# Patient Record
Sex: Male | Born: 2000
Health system: Southern US, Community
[De-identification: ages and names within clinical notes are randomized; demographics above are authoritative.]

## PROBLEM LIST (undated history)

## (undated) DIAGNOSIS — F32A Depression, unspecified: Secondary | ICD-10-CM

## (undated) DIAGNOSIS — T7840XA Allergy, unspecified, initial encounter: Secondary | ICD-10-CM

## (undated) DIAGNOSIS — F909 Attention-deficit hyperactivity disorder, unspecified type: Secondary | ICD-10-CM

## (undated) DIAGNOSIS — F913 Oppositional defiant disorder: Secondary | ICD-10-CM

## (undated) DIAGNOSIS — F329 Major depressive disorder, single episode, unspecified: Secondary | ICD-10-CM

## (undated) HISTORY — DX: Depression, unspecified: F32.A

## (undated) HISTORY — DX: Major depressive disorder, single episode, unspecified: F32.9

## (undated) HISTORY — DX: Allergy, unspecified, initial encounter: T78.40XA

---
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
## 2019-03-29 ENCOUNTER — Other Ambulatory Visit: Payer: Self-pay | Admitting: Nurse Practitioner

## 2019-03-29 DIAGNOSIS — F902 Attention-deficit hyperactivity disorder, combined type: Secondary | ICD-10-CM

## 2019-04-16 ENCOUNTER — Other Ambulatory Visit: Payer: Self-pay | Admitting: Nurse Practitioner

## 2019-04-16 DIAGNOSIS — F411 Generalized anxiety disorder: Secondary | ICD-10-CM

## 2019-04-21 ENCOUNTER — Telehealth: Payer: Self-pay | Admitting: Nurse Practitioner

## 2019-04-21 NOTE — Telephone Encounter (Signed)
Questions for Screening COVID-19  Symptom onset: n/a  Travel or Contacts: no  During this illness, did/does the patient experience any of the following symptoms? Fever >100.69F []   Yes [x]   No []   Unknown Subjective fever (felt feverish) []   Yes []   No []   Unknown Chills []   Yes [x]   No []   Unknown Muscle aches (myalgia) []   Yes [x]   No []   Unknown Runny nose (rhinorrhea) []   Yes [x]   No []   Unknown Sore throat []   Yes [x]   No []   Unknown Cough (new onset or worsening of chronic cough) []   Yes [x]   No []   Unknown Shortness of breath (dyspnea) []   Yes [x]   No []   Unknown Nausea or vomiting []   Yes [x]   No []   Unknown Headache []   Yes [x]   No []   Unknown Abdominal pain  []   Yes [x]   No []   Unknown Diarrhea (?3 loose/looser than normal stools/24hr period) []   Yes [x]   No []   Unknown Other, specify:

## 2019-04-22 ENCOUNTER — Ambulatory Visit: Payer: 59 | Admitting: Nurse Practitioner

## 2019-04-22 ENCOUNTER — Encounter: Payer: Self-pay | Admitting: Nurse Practitioner

## 2019-04-22 ENCOUNTER — Other Ambulatory Visit: Payer: Self-pay

## 2019-04-22 VITALS — BP 122/74 | HR 66 | Temp 97.8°F | Ht 67.12 in | Wt 141.6 lb

## 2019-04-22 DIAGNOSIS — F902 Attention-deficit hyperactivity disorder, combined type: Secondary | ICD-10-CM | POA: Diagnosis not present

## 2019-04-22 DIAGNOSIS — F411 Generalized anxiety disorder: Secondary | ICD-10-CM

## 2019-04-22 LAB — BASIC METABOLIC PANEL
BUN: 13 mg/dL (ref 6–23)
CO2: 29 mEq/L (ref 19–32)
Calcium: 8.9 mg/dL (ref 8.4–10.5)
Chloride: 107 mEq/L (ref 96–112)
Creatinine, Ser: 0.86 mg/dL (ref 0.40–1.50)
GFR: 114.56 mL/min (ref >60.00)
Glucose, Bld: 97 mg/dL (ref 70–99)
Potassium: 4 mEq/L (ref 3.5–5.1)
Sodium: 141 mEq/L (ref 135–145)

## 2019-04-22 MED ORDER — ESCITALOPRAM OXALATE 10 MG PO TABS: 10.0000 mg | ORAL_TABLET | Freq: Every day | ORAL | 1 refills | Status: DC

## 2019-04-22 MED ORDER — GUANFACINE HCL ER 4 MG PO TB24: 4.0000 mg | ORAL_TABLET | Freq: Every day | ORAL | 1 refills | Status: DC

## 2019-04-22 NOTE — Progress Notes (Signed)
Subjective:  Patient ID: Jeffrey [name], male    DOB: [birth year]  Age: 18 y.o. MRN: [id number]  CC: Follow-up (medication refill/)  HPI ADHD: Use of intuniv at this time, admits to skipping some doses. 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 Sexually active with 54female partner at this time, describes relationship as stable, no use of condoms. Daily use of electronic cigarette with nicotine (45mg ) and marijuana.  Anxiety: Stable mood with lexapro, admits to skipping some doses GAD 7 : Generalized Anxiety Score 04/22/2019 04/26/2018  Nervous, Anxious, on Edge 2 1  Control/stop worrying 1 2  Worry too much - different things 2 2  Trouble relaxing 1 1  Restless 0 3  Easily annoyed or irritable 2 2  Afraid - awful might happen 1 1  Total GAD 7 Score 9 12   Depression screen [facility] 2/9 04/22/2019 11/09/2018 04/26/2018  Decreased Interest 0 1 1  Down, Depressed, Hopeless 2 2 2   PHQ - 2 Score 2 3 3   Altered sleeping 1 1 2   Tired, decreased energy 2 1 1   Change in appetite 0 1 3  Feeling bad or failure about yourself  2 1 1   Trouble concentrating 0 1 1  Moving slowly or fidgety/restless 0 1 1  Suicidal thoughts 0 0 0  PHQ-9 Score 7 9 12   Difficult doing work/chores - Somewhat difficult -   Reviewed past Medical, Social and Family history today.  Outpatient Medications Prior to Visit  Medication Sig Dispense Refill  . beclomethasone (BECONASE-AQ) 42 MCG/SPRAY nasal spray Place 2 sprays into both nostrils daily as needed (sinonasal congestion or itching of allergic rhinitis). Dose is for each nostril.    Marland Kitchen desloratadine (CLARINEX) 5 MG tablet Take 1 tablet (5 mg total) by mouth daily. 90 tablet 1  . busPIRone (BUSPAR) 10 MG tablet 1 tab twice daily 60 tablet 2  . escitalopram (LEXAPRO) 10 MG tablet Take half tab once a day x 7days, then 1tab daily continuous 30 tablet 2  . guanFACINE (INTUNIV) 4 MG TB24 ER  tablet Take 1 tablet (4 mg total) by mouth daily. Need office visit for additional refills 30 tablet 2  . famotidine (PEPCID) 20 MG tablet Take 1 tablet (20 mg total) by mouth daily. (Patient not taking: Reported on 04/22/2019) 14 tablet 0  . saccharomyces boulardii (FLORASTOR) 250 MG capsule Take 1 capsule (250 mg total) by mouth daily. (Patient not taking: Reported on 04/22/2019)     No facility-administered medications prior to visit.     ROS See HPI  Objective:  BP 122/74   Pulse 66   Temp 97.8 F (36.6 C) (Tympanic)   Ht 5' 7.12" (1.705 m)   Wt 141 lb 9.6 oz (64.2 kg)   SpO2 96%   BMI 22.10 kg/m   BP Readings from Last 3 Encounters:  04/22/19 122/74  08/05/18 100/76  04/26/18 92/64    Wt Readings from Last 3 Encounters:  04/22/19 141 lb 9.6 oz (64.2 kg) (32 %, Z= -0.48)*  11/09/18 126 lb (57.2 kg) (11 %, Z= -1.22)*  08/05/18 137 lb (62.1 kg) (29 %, Z= -0.57)*   * Growth percentiles are based on CDC (Boys, 2-20 Years) data.    Physical Exam Vitals signs reviewed.  Neck:     Musculoskeletal: Normal range of motion and neck supple.     Thyroid: No thyroid mass, thyromegaly or thyroid tenderness.  Cardiovascular:  Rate and Rhythm: Normal rate and regular rhythm.     Pulses: Normal pulses.     Heart sounds: Normal heart sounds.  Pulmonary:     Effort: Pulmonary effort is normal.     Breath sounds: Normal breath sounds.  Lymphadenopathy:     Cervical: No cervical adenopathy.  Neurological:     Mental Status: He is alert and oriented to person, place, and time.  Psychiatric:        Attention and Perception: Attention normal.        Mood and Affect: Affect is inappropriate.        Speech: Speech normal.        Behavior: Behavior is hyperactive.        Thought Content: Thought content normal.        Cognition and Memory: Cognition and memory normal.        Judgment: Judgment is impulsive.     Comments: Seems to minimize possible adverse effects of nicotine and  marijuana use.    Lab Results  Component Value Date   WBC 4.9 08/05/2018   HGB 15.4 08/05/2018   HCT 46.0 08/05/2018   PLT 177.0 08/05/2018   GLUCOSE 102 (H) 08/05/2018   ALT 33 08/05/2018   AST 18 08/05/2018   NA 140 08/05/2018   K 3.9 08/05/2018   CL 103 08/05/2018   CREATININE 1.07 08/05/2018   BUN 14 08/05/2018   CO2 30 08/05/2018   TSH 2.88 08/05/2018   INR 0.96 07/24/2014   HGBA1C 5.6 07/27/2014    Ct Wrist Right Wo Contrast  Result Date: 02/25/2016 CLINICAL DATA:  Right wrist pain. Right humerus fracture 2 weeks ago. EXAM: CT OF THE RIGHT WRIST WITHOUT CONTRAST TECHNIQUE: Multidetector CT imaging of the right wrist was performed according to the standard protocol. Multiplanar CT image reconstructions were also generated. COMPARISON:  07/25/2014 radiographs FINDINGS: No fracture is identified on today's CT exam. No obvious focus of soft tissue injury is observed although MRI can provide greater sensitivity. Overall I do not observe a specific cause for the patient' s wrist pain. IMPRESSION: 1. No fracture or acute abnormality is identified to explain the patient' s wrist pain. If pain persists despite conservative therapy, MRI may be warranted for further characterization. Electronically Signed   By: Gaylyn RongWalter  Liebkemann M.D.   On: 02/25/2016 13:52   Assessment & Plan:   Jeffrey [name] was seen today for follow-up.  Diagnoses and all orders for this visit:  ADHD (attention deficit hyperactivity disorder), combined type -     Basic metabolic panel -     guanFACINE (INTUNIV) 4 MG TB24 ER tablet; Take 1 tablet (4 mg total) by mouth daily. Need office visit for additional refills  Generalized anxiety disorder -     Basic metabolic panel -     escitalopram (LEXAPRO) 10 MG tablet; Take 1 tablet (10 mg total) by mouth daily.   I have discontinued Jeffrey [name]'s busPIRone, famotidine, and saccharomyces boulardii. I have also changed his escitalopram. Additionally, I am having him  maintain his beclomethasone, desloratadine, and guanFACINE.  Meds ordered this encounter  Medications  . escitalopram (LEXAPRO) 10 MG tablet    Sig: Take 1 tablet (10 mg total) by mouth daily.    Dispense:  90 tablet    Refill:  1    Order Specific Question:   Supervising Provider    Answer:   MATTHEWS, CODY [[id number]]  . guanFACINE (INTUNIV) 4 MG TB24 ER tablet  Sig: Take 1 tablet (4 mg total) by mouth daily. Need office visit for additional refills    Dispense:  90 tablet    Refill:  1    Order Specific Question:   Supervising Provider    Answer:   MATTHEWS, CODY [[id number]]    Problem List Items Addressed This Visit      Other   ADHD (attention deficit hyperactivity disorder), combined type - Primary (Chronic)   Relevant Medications   guanFACINE (INTUNIV) 4 MG TB24 ER tablet   Other Relevant Orders   Basic metabolic panel   Generalized anxiety disorder (Chronic)   Relevant Medications   escitalopram (LEXAPRO) 10 MG tablet   Other Relevant Orders   Basic metabolic panel      Follow-up: Return in about 6 months (around 10/20/2019) for ADHD and anxiety.  Wilfred Lacy, NP

## 2019-04-22 NOTE — Patient Instructions (Addendum)
Go to lab for blood draw. F/up in 54months or sooner if needed  Strongly suggest decrease and possibly eliminate of nicotine in electronic cigarette.  Also decrease marijuana use.  Coping With Anxiety, Teen Anxiety is the feeling of nervousness or worry that you might experience when faced with a stressful event, like a test or a big sports game. Occasional stress and anxiety caused by work, school, relationships, or decision-making is a normal part of life, and it can be managed through certain lifestyle habits. However, some people may experience anxiety:  Without a specific trigger.  For long periods of time.  That causes physical problems over time.  That is far more intense than typical stress. When these feelings become overwhelming and interfere with daily activities and relationships, it may indicate an anxiety disorder. If you receive a diagnosis of an anxiety disorder, your health care provider will tell you which type of anxiety you have and the possible treatments to help. How can anxiety affect me? Anxiety may make you feel uncomfortable. When you are faced with something exciting or potentially dangerous, your body responds in a way that prepares it to fight or run away. This response, called "fight or flight," is also a normal response to stress. When your brain initiates the fight and flight response, it tells your body to get the blood moving and prepare for the demands of the expected challenge. When this happens, you may experience:  A faster than usual heart rate.  Blood flowing to your big muscles  A feeling of tension and focus. In some situations, such as during a big game or performance, this response a good thing and can help you perform better. However, in most situations, this response is not helpful. When the fight and flight response lasts for hours or days, it may cause:  Tiredness or exhaustion.  Sleep problems.  Upset stomach or nausea.  Headache.   Feelings of depression. Long-term anxiety may also cause you to:  Think negative thoughts about yourself.  Experience problems and conflicts in relationships.  Distance yourself from friends, family, and activities you enjoy.  Perform poorly in school, sports, work or extracurricular activities. What are things that I can do to deal with anxiety? When you experience anxiety, you can take steps to help manage it:  Talk with a trusted friend or family member about your thoughts and feelings. Identify two or three people who you think might help.  Find an activity that helps calm you down, such as: ? Deep breathing. ? Listening to music. ? Taking a walk. ? Exercising. ? Playing sports for fun. ? Playing an instrument. ? Singing. ? Writing in a dairy. ? Drawing.  Watch a funny movie.  Read a good book.  Spend time with friends. What should I do if my anxiety gets worse? If these self-calming methods are not working or if your anxiety gets worse, you should get help from a health care provider. Talking with your health care provider or a mental health counselor is not a sign of weakness. Certain types of counseling can be very helpful in treating anxiety. A counseling professional can assess what other types of treatments could be most helpful for you. Other treatments include:  Talk therapy.  Medicines.  Biofeedback.  Meditation.  Yoga. Talk with your health care provider or counselor about what treatment options are right for you. Where can I get support? You may find that joining a support group helps you deal with your anxiety.  Resources for locating counselors or support groups in your area are available from the following sources:  [place]: www.mentalhealthamerica.net  Anxiety and Depression Association of [place] (ADAA): https://www.clark.net/  National Alliance on Mental Illness (NAMI): www.nami.org This information is not intended to replace advice given to  you by your health care provider. Make sure you discuss any questions you have with your health care provider. Document Released: 06/30/2016 Document Revised: 07/17/2017 Document Reviewed: 06/30/2016 Elsevier Patient Education  2020 [employer].

## 2019-07-07 ENCOUNTER — Other Ambulatory Visit: Payer: Self-pay | Admitting: Nurse Practitioner

## 2019-07-07 DIAGNOSIS — J310 Chronic rhinitis: Secondary | ICD-10-CM

## 2019-07-07 NOTE — Telephone Encounter (Signed)
Mother requesting busPIRone (BUSPAR) 10 MG tablet  Please send 6 month supply to   [id number] - [place], [place]   Mother requesting desloratadine ([place]) 5 MG tablet please send a 6 month supply   [facility] [address], [place], [place] [address]  [phone]  *Mother requesting rx sent in today due to patient go out of town. Mother states patient was last seen 04/22/2019 and stated all medications should have been sent in for a 6 month supply and would like a follow up call today best # [phone]

## 2019-07-07 NOTE — Telephone Encounter (Signed)
Requested medication (s) are due for refill today: yes  Requested medication (s) are on the active medication list: yes  Last refill:  10/21/2018  Future visit scheduled: no  Notes to clinic:  busPIRone (BUSPAR) 10 MG tablet  Please send 6 month supply . Medication not on current medication list and patient needs sent to different pharmacy listed below   Requested Prescriptions  Pending Prescriptions Disp Refills   desloratadine (CLARINEX) 5 MG tablet 90 tablet 1    Sig: Take 1 tablet (5 mg total) by mouth daily.     Ear, Nose, and Throat:  Antihistamines Passed - 07/07/2019 12:30 PM      Passed - Valid encounter within last 12 months    Recent Outpatient Visits          2 months ago ADHD (attention deficit hyperactivity disorder), combined type   [facility], Charlene Brooke, NP   8 months ago ADHD (attention deficit hyperactivity disorder), combined type   [facility], Charlene Brooke, NP   11 months ago Preventative health care   [facility], Charlene Brooke, NP   1 year ago ADHD (attention deficit hyperactivity disorder), combined type   [facility], Charlene Brooke, NP

## 2019-07-08 MED ORDER — BUSPIRONE HCL 10 MG PO TABS: 10.0000 mg | ORAL_TABLET | Freq: Two times a day (BID) | ORAL | 5 refills | Status: DC

## 2019-07-08 MED ORDER — DESLORATADINE 5 MG PO TABS: 5.0000 mg | ORAL_TABLET | Freq: Every day | ORAL | 1 refills | Status: DC

## 2019-07-08 NOTE — Addendum Note (Signed)
Addended byShawnie Pons on: 07/08/2019 04:34 PM   Modules accepted: Orders

## 2019-07-08 NOTE — Telephone Encounter (Signed)
Ok to send buspar x 48months.

## 2019-07-08 NOTE — Telephone Encounter (Signed)
Pt is aware rx sent as pt requested and right pharmacy.

## 2019-07-08 NOTE — Telephone Encounter (Signed)
Charlotte please advise, this rx d/c back in 10/2018.   LVM for the pt to call back

## 2019-07-08 NOTE — Telephone Encounter (Signed)
Jeffrey [name] please advise, ok to send in 6 mo supply for Clarinex 5 mg---pt request this today if possible--pt is going away for the weekend and need this rx send Jeffrey [name] on [address]    and please advise about the buspar as well.

## 2020-04-05 ENCOUNTER — Other Ambulatory Visit: Payer: Self-pay | Admitting: Nurse Practitioner

## 2020-04-05 DIAGNOSIS — F411 Generalized anxiety disorder: Secondary | ICD-10-CM

## 2020-04-05 NOTE — Telephone Encounter (Signed)
Medication refill request.   Last filled 04/22/19   Appt scheduled 04/18/20 @ 0815 for additional medication refills. PCP and patient are aware.

## 2020-04-13 ENCOUNTER — Other Ambulatory Visit: Payer: Self-pay | Admitting: Nurse Practitioner

## 2020-04-13 DIAGNOSIS — F902 Attention-deficit hyperactivity disorder, combined type: Secondary | ICD-10-CM

## 2020-04-13 NOTE — Telephone Encounter (Signed)
Medication refill request for Guanfacine. Refill requested denied. Patient has pending appt with PCP on 04/18/20 @ 0815 for medication management.

## 2020-04-18 ENCOUNTER — Ambulatory Visit (INDEPENDENT_AMBULATORY_CARE_PROVIDER_SITE_OTHER): Payer: 59 | Admitting: Nurse Practitioner

## 2020-04-18 ENCOUNTER — Encounter: Payer: Self-pay | Admitting: Nurse Practitioner

## 2020-04-18 ENCOUNTER — Other Ambulatory Visit: Payer: Self-pay

## 2020-04-18 VITALS — BP 120/72 | HR 72 | Temp 97.4°F | Ht 66.0 in | Wt 154.0 lb

## 2020-04-18 DIAGNOSIS — J310 Chronic rhinitis: Secondary | ICD-10-CM

## 2020-04-18 DIAGNOSIS — F1290 Cannabis use, unspecified, uncomplicated: Secondary | ICD-10-CM | POA: Diagnosis not present

## 2020-04-18 DIAGNOSIS — F411 Generalized anxiety disorder: Secondary | ICD-10-CM | POA: Diagnosis not present

## 2020-04-18 DIAGNOSIS — F902 Attention-deficit hyperactivity disorder, combined type: Secondary | ICD-10-CM

## 2020-04-18 LAB — COMPREHENSIVE METABOLIC PANEL
ALT: 21 U/L (ref 0–53)
AST: 20 U/L (ref 0–37)
Albumin: 4.3 g/dL (ref 3.5–5.2)
Alkaline Phosphatase: 48 U/L — ABNORMAL LOW (ref 52–171)
BUN: 9 mg/dL (ref 6–23)
CO2: 29 mEq/L (ref 19–32)
Calcium: 8.5 mg/dL (ref 8.4–10.5)
Chloride: 102 mEq/L (ref 96–112)
Creatinine, Ser: 1.03 mg/dL (ref 0.40–1.50)
GFR: 92.07 mL/min (ref >60.00)
Glucose, Bld: 89 mg/dL (ref 70–99)
Potassium: 4.2 mEq/L (ref 3.5–5.1)
Sodium: 137 mEq/L (ref 135–145)
Total Bilirubin: 0.2 mg/dL (ref 0.2–1.2)
Total Protein: 6.1 g/dL (ref 6.0–8.3)

## 2020-04-18 LAB — LIPID PANEL
Cholesterol: 161 mg/dL (ref 0–200)
HDL: 53.7 mg/dL (ref >39.00)
LDL Cholesterol: 75 mg/dL (ref 0–99)
NonHDL: 107.13
Total CHOL/HDL Ratio: 3
Triglycerides: 162 mg/dL — ABNORMAL HIGH (ref 0.0–149.0)
VLDL: 32.4 mg/dL (ref 0.0–40.0)

## 2020-04-18 LAB — TSH: TSH: 1.53 u[IU]/mL (ref 0.40–5.00)

## 2020-04-18 MED ORDER — ESCITALOPRAM OXALATE 10 MG PO TABS: 10.0000 mg | ORAL_TABLET | Freq: Every day | ORAL | 3 refills | Status: DC

## 2020-04-18 MED ORDER — DESLORATADINE 5 MG PO TABS: 5.0000 mg | ORAL_TABLET | Freq: Every day | ORAL | 1 refills | Status: DC

## 2020-04-18 MED ORDER — BUSPIRONE HCL 10 MG PO TABS: 10.0000 mg | ORAL_TABLET | Freq: Two times a day (BID) | ORAL | 3 refills | Status: DC

## 2020-04-18 MED ORDER — GUANFACINE HCL ER 4 MG PO TB24: 4.0000 mg | ORAL_TABLET | Freq: Every day | ORAL | 3 refills | Status: DC

## 2020-04-18 NOTE — Assessment & Plan Note (Addendum)
Reports he smokes 1g per day manage anxiety. Denies any other illicit drug use Offered referral to therapist and suggested other ways to manage his mood (good sleep hygiene, minimize caffeine intake, exercise, and healthy diet, meditation), compliance with lexapro and buspar. Counseled about possible long term effects of chronic use (worsening anxiety, psychosis, diarrhea, heart and lung disease etc). He states he has no desire to quit at this time. He declined referral to therapist.

## 2020-04-18 NOTE — Assessment & Plan Note (Signed)
Waxing and waning, he notes improved mood when compliant with lexapro and buspar Depression screen [facility] 2/9 04/18/2020 04/22/2019 11/09/2018  Decreased Interest 0 0 1  Down, Depressed, Hopeless 1 2 2   PHQ - 2 Score 1 2 3   Altered sleeping 2 1 1   Tired, decreased energy 1 2 1   Change in appetite 1 0 1  Feeling bad or failure about yourself  1 2 1   Trouble concentrating 0 0 1  Moving slowly or fidgety/restless 1 0 1  Suicidal thoughts 0 0 0  PHQ-9 Score 7 7 9   Difficult doing work/chores Somewhat difficult - Somewhat difficult   GAD 7 : Generalized Anxiety Score 04/18/2020 04/22/2019 04/26/2018  Nervous, Anxious, on Edge 1 2 1   Control/stop worrying 1 1 2   Worry too much - different things 0 2 2  Trouble relaxing 0 1 1  Restless 1 0 3  Easily annoyed or irritable 1 2 2   Afraid - awful might happen 0 1 1  Total GAD 7 Score 4 9 12   Anxiety Difficulty Not difficult at all - -   Resume medications

## 2020-04-18 NOTE — Assessment & Plan Note (Signed)
Inconsistent with medication compliance. Wants to resume medication due to improve performance with school and work responsibilities.  Resume intuniv Check cbc, cmp, and TSH

## 2020-04-18 NOTE — Patient Instructions (Addendum)
Go to lab for blood draw  What You Need to Know About Marijuana Use Marijuana is a mixture of the dried leaves and flowers of the hemp plant Cannabis sativa. The plant's active ingredients (cannabinoids) change the chemistry of the brain. If you smoke or eat marijuana, you will experience changes in the way you think, feel, and behave. Many people use marijuana because it helps them relax and puts them in a pleasurable mood (marijuana high). Some people use marijuana for medical effects, such as:  Reduced nausea.  Increased appetite.  Reduced muscle spasm.  Pain relief. Researchers are studying other possible medical uses for marijuana. How can marijuana use affect me? Many people find a marijuana high to be pleasurable and relaxing. Other people find a marijuana high to be uncomfortable or anxiety-causing. This drug can cause short-term and long-term physical and mental effects. Taking high doses of marijuana or trying to quit marijuana can also affect you. Short-term effects of marijuana use include:  Temporary relief of symptoms from a medical condition.  Changes in mood and perception (feeling high).  Increased hunger.  Increased heart rate.  Slowed movement and reaction time.  Poor memory, judgment, and problem solving ability.  Altered sense of time.  Changes to vision.  Bloodshot eyes.  Coughing. Long-term effects of marijuana use include:  Higher risk of lung and breathing problems.  Higher risk of heart attack.  Higher risk of testicular cancer.  Mental and physical dependence (addiction).  Slowed brain development in young people. Babies whose mothers used marijuana during pregnancy have an increased risk of problems with brain development and behavior.  Temporary periods of false perceptions or beliefs (hallucinations or paranoia).  Worsening of mental illness.  Onset of new mental illness such as anxiety, depression, or suicidal thoughts.  Withdrawal  symptoms when stopping marijuana, such as sleeplessness, anxiety, cravings, and anger.  Difficulty maintaining healthy relationships.  Poor memory, and difficulty concentrating and learning. This can result in decreased intelligence and poor performance at school or work, and an increased risk of dropping out of school.  Higher risk of using other substances like alcohol and nicotine. High doses of marijuana can cause:  Panic.  Anxiety.  Mental confusion.  Hallucinations. Quitting marijuana after using it for a long time can cause withdrawal symptoms, such as:  Headache.  Shakiness.  Sweating.  Stomach pain.  Nausea.  Restlessness.  Irritability.  Trouble sleeping.  Decreased appetite. What are the benefits of not using marijuana? Not using marijuana can keep you from becoming dependent on it. You can avoid the negative effects of the drug that can reduce your quality of life. You can avoid accidents caused by the slowed reaction time that is common with marijuana use. If I already use marijuana, what steps can I take to stop using it? If you are not physically or mentally dependent on marijuana, you should be able to stop using it on your own. If you cannot stop on your own, ask your health care provider for help. Treatment for marijuana addiction is similar to treatment for other addictions. It may include:  Cognitive-behavioral therapy (psychotherapy). This may include individual or group therapy.  Joining a support group.  Treating medical, behavioral, or mental health conditions that exist along with marijuana dependency.  Where can I get more information? Learn more about:  Marijuana from the [place] [employer] on Drug Abuse: Natworking.hu  Medical marijuana from the [employer] of Health: RunningShows.co.za  Treatment options from the Substance Abuse and  Mental Health Services  Administration: https://findtreatment.http://gonzalez-rivas.net/  Recovery from marijuana dependency from Recovery.org: http://www.recovery.org/topics/marijuana-recovery When should I seek medical care? Talk with your health care provider if:  You want to stop using marijuana but you cannot.  You have withdrawal symptoms when you try to stop using marijuana.  You are using marijuana every day.  You are using marijuana along with other drugs like cocaine or alcohol.  You have anxiety or depression.  You have hallucinations or paranoia.  Marijuana use is interfering with your relationships or your ability to function normally at school or at work. Summary  You may become physically or mentally dependent on marijuana.  Long-term use may interfere with your ability to function normally at home, school, or work.  Marijuana addiction is treatable. This information is not intended to replace advice given to you by your health care provider. Make sure you discuss any questions you have with your health care provider. Document Revised: 07/17/2017 Document Reviewed: 04/23/2016 Elsevier Patient Education  2020 [employer].

## 2020-04-18 NOTE — Progress Notes (Signed)
Subjective:  Patient ID: Jeffrey [name], male    DOB: [birth year]  Age: 19 y.o. MRN: [id number]  CC: Follow-up (6 month f/u on ADHD and medication refill. )  HPI  Marijuana use, continuous Reports he smokes 1g per day manage anxiety. Denies any other illicit drug use Offered referral to therapist and suggested other ways to manage his mood (good sleep hygiene, minimize caffeine intake, exercise, and healthy diet, meditation), compliance with lexapro and buspar. Counseled about possible long term effects of chronic use (worsening anxiety, psychosis, diarrhea, heart and lung disease etc). He states he has no desire to quit at this time. He declined referral to therapist.  Generalized anxiety disorder Waxing and waning, he notes improved mood when compliant with lexapro and buspar Depression screen [facility] 2/9 04/18/2020 04/22/2019 11/09/2018  Decreased Interest 0 0 1  Down, Depressed, Hopeless 1 2 2   PHQ - 2 Score 1 2 3   Altered sleeping 2 1 1   Tired, decreased energy 1 2 1   Change in appetite 1 0 1  Feeling bad or failure about yourself  1 2 1   Trouble concentrating 0 0 1  Moving slowly or fidgety/restless 1 0 1  Suicidal thoughts 0 0 0  PHQ-9 Score 7 7 9   Difficult doing work/chores Somewhat difficult - Somewhat difficult   GAD 7 : Generalized Anxiety Score 04/18/2020 04/22/2019 04/26/2018  Nervous, Anxious, on Edge 1 2 1   Control/stop worrying 1 1 2   Worry too much - different things 0 2 2  Trouble relaxing 0 1 1  Restless 1 0 3  Easily annoyed or irritable 1 2 2   Afraid - awful might happen 0 1 1  Total GAD 7 Score 4 9 12   Anxiety Difficulty Not difficult at all - -   Resume medications  ADHD (attention deficit hyperactivity disorder), combined type Inconsistent with medication compliance. Wants to resume medication due to improve performance with school and work responsibilities.  Resume intuniv Check cbc, cmp, and TSH  Wt Readings from Last 3 Encounters:  04/18/20 154 lb  (69.9 kg) (47 %, Z= -0.07)*  04/22/19 141 lb 9.6 oz (64.2 kg) (32 %, Z= -0.48)*  11/09/18 126 lb (57.2 kg) (11 %, Z= -1.22)*   * Growth percentiles are based on CDC (Boys, 2-20 Years) data.   Reviewed past Medical, Social and Family history today.  Outpatient Medications Prior to Visit  Medication Sig Dispense Refill  . beclomethasone (BECONASE-AQ) 42 MCG/SPRAY nasal spray Place 2 sprays into both nostrils daily as needed (sinonasal congestion or itching of allergic rhinitis). Dose is for each nostril.    . busPIRone (BUSPAR) 10 MG tablet Take 1 tablet (10 mg total) by mouth 2 (two) times daily. 60 tablet 5  . desloratadine (CLARINEX) 5 MG tablet Take 1 tablet (5 mg total) by mouth daily. 90 tablet 1  . escitalopram (LEXAPRO) 10 MG tablet Take 1 tablet (10 mg total) by mouth daily. 90 tablet 1  . guanFACINE (INTUNIV) 4 MG TB24 ER tablet Take 1 tablet (4 mg total) by mouth daily. Need office visit for additional refills 90 tablet 1   No facility-administered medications prior to visit.    ROS See HPI  Objective:  BP 120/72 (BP Location: Left Arm, Patient Position: Sitting, Cuff Size: Normal)   Pulse 72   Temp (!) 97.4 F (36.3 C) (Temporal)   Ht 5\' 6"  (1.676 m)   Wt 154 lb (69.9 kg)   SpO2 98%   BMI 24.86 kg/m  Physical Exam Psychiatric:        Attention and Perception: Attention normal.        Mood and Affect: Mood is anxious.        Speech: Speech normal.        Behavior: Behavior is hyperactive. Behavior is cooperative.        Thought Content: Thought content normal.        Cognition and Memory: Cognition normal.        Judgment: Judgment is impulsive.    Assessment & Plan:  This visit occurred during the SARS-CoV-2 public health emergency.  Safety protocols were in place, including screening questions prior to the visit, additional usage of staff PPE, and extensive cleaning of exam room while observing appropriate contact time as indicated for disinfecting solutions.     Jeffrey [name] was seen today for follow-up.  Diagnoses and all orders for this visit:  Generalized anxiety disorder -     Comprehensive metabolic panel -     Lipid panel -     TSH -     escitalopram (LEXAPRO) 10 MG tablet; Take 1 tablet (10 mg total) by mouth daily.  ADHD (attention deficit hyperactivity disorder), combined type -     Comprehensive metabolic panel -     Lipid panel -     TSH -     guanFACINE (INTUNIV) 4 MG TB24 ER tablet; Take 1 tablet (4 mg total) by mouth daily. Need office visit for additional refills  Chronic rhinitis -     desloratadine (CLARINEX) 5 MG tablet; Take 1 tablet (5 mg total) by mouth daily.  Marijuana use, continuous  Other orders -     busPIRone (BUSPAR) 10 MG tablet; Take 1 tablet (10 mg total) by mouth 2 (two) times daily.    Problem List Items Addressed This Visit      Other   ADHD (attention deficit hyperactivity disorder), combined type (Chronic)    Inconsistent with medication compliance. Wants to resume medication due to improve performance with school and work responsibilities.  Resume intuniv Check cbc, cmp, and TSH      Relevant Medications   guanFACINE (INTUNIV) 4 MG TB24 ER tablet   Other Relevant Orders   Comprehensive metabolic panel   Lipid panel   TSH   Generalized anxiety disorder - Primary (Chronic)    Waxing and waning, he notes improved mood when compliant with lexapro and buspar Depression screen [facility] 2/9 04/18/2020 04/22/2019 11/09/2018  Decreased Interest 0 0 1  Down, Depressed, Hopeless 1 2 2   PHQ - 2 Score 1 2 3   Altered sleeping 2 1 1   Tired, decreased energy 1 2 1   Change in appetite 1 0 1  Feeling bad or failure about yourself  1 2 1   Trouble concentrating 0 0 1  Moving slowly or fidgety/restless 1 0 1  Suicidal thoughts 0 0 0  PHQ-9 Score 7 7 9   Difficult doing work/chores Somewhat difficult - Somewhat difficult   GAD 7 : Generalized Anxiety Score 04/18/2020 04/22/2019 04/26/2018  Nervous, Anxious, on Edge 1  2 1   Control/stop worrying 1 1 2   Worry too much - different things 0 2 2  Trouble relaxing 0 1 1  Restless 1 0 3  Easily annoyed or irritable 1 2 2   Afraid - awful might happen 0 1 1  Total GAD 7 Score 4 9 12   Anxiety Difficulty Not difficult at all - -   Resume medications  Relevant Medications   escitalopram (LEXAPRO) 10 MG tablet   busPIRone (BUSPAR) 10 MG tablet   Other Relevant Orders   Comprehensive metabolic panel   Lipid panel   TSH   Marijuana use, continuous    Reports he smokes 1g per day manage anxiety. Denies any other illicit drug use Offered referral to therapist and suggested other ways to manage his mood (good sleep hygiene, minimize caffeine intake, exercise, and healthy diet, meditation), compliance with lexapro and buspar. Counseled about possible long term effects of chronic use (worsening anxiety, psychosis, diarrhea, heart and lung disease etc). He states he has no desire to quit at this time. He declined referral to therapist.       Other Visit Diagnoses    Chronic rhinitis       Relevant Medications   desloratadine (CLARINEX) 5 MG tablet      Follow-up: Return in about 6 months (around 10/16/2020) for ADHD, and GAD.  Alysia Penna, NP

## 2020-07-03 ENCOUNTER — Other Ambulatory Visit: Payer: Self-pay | Admitting: Nurse Practitioner

## 2020-07-03 DIAGNOSIS — J310 Chronic rhinitis: Secondary | ICD-10-CM

## 2020-10-17 ENCOUNTER — Ambulatory Visit (INDEPENDENT_AMBULATORY_CARE_PROVIDER_SITE_OTHER): Payer: 59 | Admitting: Nurse Practitioner

## 2020-10-17 ENCOUNTER — Encounter: Payer: Self-pay | Admitting: Nurse Practitioner

## 2020-10-17 ENCOUNTER — Other Ambulatory Visit: Payer: Self-pay

## 2020-10-17 VITALS — BP 120/78 | HR 94 | Temp 98.1°F | Ht 66.0 in | Wt 151.6 lb

## 2020-10-17 DIAGNOSIS — F411 Generalized anxiety disorder: Secondary | ICD-10-CM

## 2020-10-17 DIAGNOSIS — F902 Attention-deficit hyperactivity disorder, combined type: Secondary | ICD-10-CM

## 2020-10-17 DIAGNOSIS — Z9114 Patient's other noncompliance with medication regimen: Secondary | ICD-10-CM

## 2020-10-17 MED ORDER — BUSPIRONE HCL 10 MG PO TABS: 10.0000 mg | ORAL_TABLET | Freq: Two times a day (BID) | ORAL | 3 refills | Status: DC

## 2020-10-17 MED ORDER — ESCITALOPRAM OXALATE 10 MG PO TABS: 10.0000 mg | ORAL_TABLET | Freq: Every day | ORAL | 3 refills | Status: DC

## 2020-10-17 MED ORDER — GUANFACINE HCL ER 4 MG PO TB24: 4.0000 mg | ORAL_TABLET | Freq: Every day | ORAL | 3 refills | Status: DC

## 2020-10-17 NOTE — Assessment & Plan Note (Signed)
Waxing and waning due to inconsistent medication compliance. Denies need for psychology referral. Reminder about use of daily reminder to help maintain compliance (phone and dry erase board). Maintain lexapro and buspar Refill sent

## 2020-10-17 NOTE — Progress Notes (Signed)
Subjective:  Patient ID: Jeffrey [name], male    DOB: Nov 23, 2000  Age: 20 y.o. MRN: [id number]  CC: Follow-up (6 month f/u for anxiety and ADHD. )  HPI  Generalized anxiety disorder Waxing and waning due to inconsistent medication compliance. Denies need for psychology referral. Reminder about use of daily reminder to help maintain compliance (phone and dry erase board). Maintain lexapro and buspar Refill sent  ADHD (attention deficit hyperactivity disorder), combined type Waxing and waning due to inconsistent medication compliance. No dizziness or headache or palpitation or chest pain. Maintain current medication Intuiniv refill sent  Depression screen [facility] 2/9 10/17/2020 04/18/2020 04/22/2019  Decreased Interest - 0 0  Down, Depressed, Hopeless 1 1 2   PHQ - 2 Score 1 1 2   Altered sleeping 2 2 1   Tired, decreased energy 1 1 2   Change in appetite 1 1 0  Feeling bad or failure about yourself  1 1 2   Trouble concentrating 2 0 0  Moving slowly or fidgety/restless 2 1 0  Suicidal thoughts 0 0 0  PHQ-9 Score 10 7 7   Difficult doing work/chores Somewhat difficult Somewhat difficult -   GAD 7 : Generalized Anxiety Score 10/17/2020 04/18/2020 04/22/2019 04/26/2018  Nervous, Anxious, on Edge 2 1 2 1   Control/stop worrying 1 1 1 2   Worry too much - different things 2 0 2 2  Trouble relaxing 1 0 1 1  Restless 1 1 0 3  Easily annoyed or irritable 2 1 2 2   Afraid - awful might happen 1 0 1 1  Total GAD 7 Score 10 4 9 12   Anxiety Difficulty Somewhat difficult Not difficult at all - -   Reviewed past Medical, Social and Family history today.  Outpatient Medications Prior to Visit  Medication Sig Dispense Refill  . beclomethasone (BECONASE-AQ) 42 MCG/SPRAY nasal spray Place 2 sprays into both nostrils daily as needed (sinonasal congestion or itching of allergic rhinitis). Dose is for each nostril.    desloratadine (CLARINEX) 5 MG tablet TAKE ONE TABLET BY MOUTH DAILY 90 tablet 1  .  busPIRone (BUSPAR) 10 MG tablet Take 1 tablet (10 mg total) by mouth 2 (two) times daily. 180 tablet 3  . escitalopram (LEXAPRO) 10 MG tablet Take 1 tablet (10 mg total) by mouth daily. 90 tablet 3  . guanFACINE (INTUNIV) 4 MG TB24 ER tablet Take 1 tablet (4 mg total) by mouth daily. Need office visit for additional refills 90 tablet 3   No facility-administered medications prior to visit.   ROS See HPI  Objective:  BP 120/78 (BP Location: Left Arm, Patient Position: Sitting, Cuff Size: Normal)   Pulse 94   Temp 98.1 F (36.7 C) (Temporal)   Ht 5\' 6"  (1.676 m)   Wt 151 lb 9.6 oz (68.8 kg)   SpO2 98%   BMI 24.47 kg/m   Physical Exam Neurological:     Mental Status: He is alert and oriented to person, place, and time.  Psychiatric:        Attention and Perception: Attention normal.        Mood and Affect: Mood is anxious.        Speech: Speech normal.        Behavior: Behavior is agitated.        Thought Content: Thought content normal.        Cognition and Memory: Cognition normal.        Judgment: Judgment is impulsive.    Assessment & Plan:  This visit occurred during the SARS-CoV-2 public health emergency.  Safety protocols were in place, including screening questions prior to the visit, additional usage of staff PPE, and extensive cleaning of exam room while observing appropriate contact time as indicated for disinfecting solutions.   Tylik was seen today for follow-up.  Diagnoses and all orders for this visit:  ADHD (attention deficit hyperactivity disorder), combined type -     guanFACINE (INTUNIV) 4 MG TB24 ER tablet; Take 1 tablet (4 mg total) by mouth daily.  Generalized anxiety disorder -     busPIRone (BUSPAR) 10 MG tablet; Take 1 tablet (10 mg total) by mouth 2 (two) times daily. -     escitalopram (LEXAPRO) 10 MG tablet; Take 1 tablet (10 mg total) by mouth daily.   Problem List Items Addressed This Visit      Other   ADHD (attention deficit  hyperactivity disorder), combined type - Primary (Chronic)    Waxing and waning due to inconsistent medication compliance. No dizziness or headache or palpitation or chest pain. Maintain current medication Intuiniv refill sent      Relevant Medications   guanFACINE (INTUNIV) 4 MG TB24 ER tablet   Generalized anxiety disorder (Chronic)    Waxing and waning due to inconsistent medication compliance. Denies need for psychology referral. Reminder about use of daily reminder to help maintain compliance (phone and dry erase board). Maintain lexapro and buspar Refill sent      Relevant Medications   busPIRone (BUSPAR) 10 MG tablet   escitalopram (LEXAPRO) 10 MG tablet      Follow-up: Return in about 6 months (around 04/19/2021) for CPE (fasting).  Alysia Penna, NP

## 2020-10-17 NOTE — Assessment & Plan Note (Signed)
Waxing and waning due to inconsistent medication compliance. No dizziness or headache or palpitation or chest pain. Maintain current medication Intuiniv refill sent

## 2021-04-02 ENCOUNTER — Other Ambulatory Visit: Payer: Self-pay | Admitting: Nurse Practitioner

## 2021-04-02 DIAGNOSIS — J310 Chronic rhinitis: Secondary | ICD-10-CM

## 2021-04-02 NOTE — Telephone Encounter (Signed)
Chart supports rx refill Last ov: 10/17/2020 Last refill: 12/31/2020

## 2021-10-30 ENCOUNTER — Other Ambulatory Visit: Payer: Self-pay | Admitting: Nurse Practitioner

## 2021-10-30 DIAGNOSIS — F411 Generalized anxiety disorder: Secondary | ICD-10-CM

## 2021-10-30 DIAGNOSIS — F902 Attention-deficit hyperactivity disorder, combined type: Secondary | ICD-10-CM

## 2021-10-30 DIAGNOSIS — J310 Chronic rhinitis: Secondary | ICD-10-CM

## 2022-01-03 ENCOUNTER — Ambulatory Visit (INDEPENDENT_AMBULATORY_CARE_PROVIDER_SITE_OTHER): Payer: No Typology Code available for payment source | Admitting: Family Medicine

## 2022-01-03 ENCOUNTER — Encounter: Payer: Self-pay | Admitting: Family Medicine

## 2022-01-03 VITALS — BP 120/74 | HR 94 | Temp 97.4°F | Ht 66.0 in | Wt 142.4 lb

## 2022-01-03 DIAGNOSIS — S01502A Unspecified open wound of oral cavity, initial encounter: Secondary | ICD-10-CM | POA: Diagnosis not present

## 2022-01-03 NOTE — Patient Instructions (Signed)
Use salt water, swishes in mouth to keep wound clean.  Follow-up if any signs of infection.

## 2022-01-03 NOTE — Progress Notes (Signed)
[facility] [address] [place] [address] Dept: [phone] Dept Fax: [phone]  Office Visit  Subjective:    Patient ID: Jeffrey [name], male    DOB: Apr 25, 2001, 21 y.o..   MRN: [id number]  Chief Complaint  Patient presents with   Acute Visit    C/o having tooth go through check after a boxing match x 3 days.     History of Present Illness:  Patient is in today for evaluation of a laceration inside his mouth. 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 He notes that 2 nights ago, he was in a 40 sec. bout. He notes a punch landed on his left face, driving his bicuspid into the buccal mucosa. He was not wearing a mouth guard at the time. He has been brushing his teeth frequently and did swish salt water.  Past Medical History: Patient Active Problem List   Diagnosis Date Noted   Generalized abdominal pain 08/05/2018   Dyspepsia 08/05/2018   Generalized anxiety disorder 11/20/2015   ODD (oppositional defiant disorder) 08/02/2014   Neuroleptic induced acute dystonia 08/02/2014   ADHD (attention deficit hyperactivity disorder), combined type 07/26/2014   Chronic vocal tic disorder 07/26/2014   Acute sedative, hypnotic, or anxiolytic intoxication delirium with mild use disorder (HCC) 07/24/2014   Marijuana use, continuous    No past surgical history on file.  Family History  Problem Relation Age of Onset   ADD / ADHD Mother        not diagnosed   Alcohol abuse Father        recovering   Hyperlipidemia Father    Mental retardation Maternal Aunt    Hyperlipidemia Maternal Grandmother    Hypertension Maternal Grandmother    Early death Maternal Grandfather    Multiple sclerosis Maternal Grandfather    Birth defects Paternal Grandmother        breast   Hyperlipidemia Paternal Grandmother    Hypertension Paternal Grandmother    Intellectual disability Paternal Grandmother    Hyperlipidemia Paternal  Grandfather    Outpatient Medications Prior to Visit  Medication Sig Dispense Refill   beclomethasone (BECONASE-AQ) 42 MCG/SPRAY nasal spray Place 2 sprays into both nostrils daily as needed (sinonasal congestion or itching of allergic rhinitis). Dose is for each nostril.     busPIRone (BUSPAR) 10 MG tablet Take 1 tablet (10 mg total) by mouth 2 (two) times daily. Need office visit for additional refills 180 tablet 0   desloratadine (CLARINEX) 5 MG tablet Take 1 tablet (5 mg total) by mouth daily. Need office visit for additional refills 90 tablet 0   escitalopram (LEXAPRO) 10 MG tablet Take 1 tablet (10 mg total) by mouth daily. Need office visit for additional refills 90 tablet 0   guanFACINE (INTUNIV) 4 MG TB24 ER tablet Take 1 tablet (4 mg total) by mouth daily. Need office visit for additional refills 90 tablet 0   No facility-administered medications prior to visit.   Allergies  Allergen Reactions   Dust Mite Extract Other (See Comments)    Reaction: unknown      Objective:   Today's Vitals   01/03/22 1320  BP: 120/74  Pulse: 94  Temp: (!) 97.4 F (36.3 C)  TempSrc: Temporal  SpO2: 97%  Weight: 142 lb 6.4 oz (64.6 kg)  Height: 5\' 6"  (1.676 m)   Body mass index is 22.98 kg/m.   General: Well developed, well nourished. No acute distress. Mouth: There is a 2 cm  laceration to the left buccal mucosa, extending out to the angle of the mouth. No redness.   Minor swelling. Psych: Alert and oriented. Normal mood and affect.  Health Maintenance Due  Topic Date Due   Hepatitis C Screening  Never done   TETANUS/TDAP  04/01/2021     Assessment & Plan:   1. Open wound of buccal mucosa, initial encounter Too late to attempt primary closure, so will allow to heal by secondary intention. Recommend good oral care, including continuing salt water swishes. Follow-up if any signs of infection.   Return if symptoms worsen or fail to improve.   Loyola Mast, MD

## 2022-01-28 ENCOUNTER — Other Ambulatory Visit: Payer: Self-pay | Admitting: Nurse Practitioner

## 2022-01-28 DIAGNOSIS — J310 Chronic rhinitis: Secondary | ICD-10-CM

## 2022-01-28 DIAGNOSIS — F411 Generalized anxiety disorder: Secondary | ICD-10-CM

## 2022-01-28 DIAGNOSIS — F902 Attention-deficit hyperactivity disorder, combined type: Secondary | ICD-10-CM

## 2022-01-29 ENCOUNTER — Telehealth: Payer: Self-pay | Admitting: Nurse Practitioner

## 2022-01-29 NOTE — Telephone Encounter (Signed)
Called & spoke w mom,. Adv that he hasn't been seen in over a year & would need to schedule an appt for those refills.

## 2022-01-29 NOTE — Telephone Encounter (Signed)
Pt mom went to pharmacy to get refill on 4 different kinds but they was denied and mom want to know why   1- if it's to early  2- do he need to be seen 1st to get a refill  Mom said you can call her : [name] [phone]

## 2022-01-31 ENCOUNTER — Ambulatory Visit (INDEPENDENT_AMBULATORY_CARE_PROVIDER_SITE_OTHER): Payer: No Typology Code available for payment source | Admitting: Nurse Practitioner

## 2022-01-31 ENCOUNTER — Encounter: Payer: Self-pay | Admitting: Nurse Practitioner

## 2022-01-31 VITALS — BP 110/68 | HR 60 | Temp 97.5°F | Ht 66.0 in | Wt 141.4 lb

## 2022-01-31 DIAGNOSIS — F902 Attention-deficit hyperactivity disorder, combined type: Secondary | ICD-10-CM

## 2022-01-31 DIAGNOSIS — J324 Chronic pansinusitis: Secondary | ICD-10-CM

## 2022-01-31 DIAGNOSIS — F411 Generalized anxiety disorder: Secondary | ICD-10-CM

## 2022-01-31 DIAGNOSIS — J310 Chronic rhinitis: Secondary | ICD-10-CM | POA: Diagnosis not present

## 2022-01-31 DIAGNOSIS — Z23 Encounter for immunization: Secondary | ICD-10-CM

## 2022-01-31 DIAGNOSIS — J3089 Other allergic rhinitis: Secondary | ICD-10-CM | POA: Diagnosis not present

## 2022-01-31 DIAGNOSIS — F39 Unspecified mood [affective] disorder: Secondary | ICD-10-CM | POA: Insufficient documentation

## 2022-01-31 MED ORDER — BUSPIRONE HCL 10 MG PO TABS: 10.0000 mg | ORAL_TABLET | Freq: Two times a day (BID) | ORAL | 3 refills | Status: AC

## 2022-01-31 MED ORDER — ESCITALOPRAM OXALATE 10 MG PO TABS: 10.0000 mg | ORAL_TABLET | Freq: Every day | ORAL | 3 refills | Status: AC

## 2022-01-31 MED ORDER — DESLORATADINE 5 MG PO TABS: 5.0000 mg | ORAL_TABLET | Freq: Every day | ORAL | 3 refills | Status: AC

## 2022-01-31 MED ORDER — AZELASTINE HCL 0.15 % NA SOLN: 1.0000 | Freq: Two times a day (BID) | NASAL | 5 refills | Status: AC

## 2022-01-31 MED ORDER — GUANFACINE HCL ER 4 MG PO TB24: 4.0000 mg | ORAL_TABLET | Freq: Every day | ORAL | 3 refills | Status: AC

## 2022-01-31 MED ORDER — BUPROPION HCL ER (XL) 150 MG PO TB24: 150.0000 mg | ORAL_TABLET | Freq: Every day | ORAL | 5 refills | Status: DC

## 2022-01-31 NOTE — Progress Notes (Signed)
Established Patient Visit  Patient: Jeffrey [name]   DOB: 09/13/2000   20 y.o. Male  MRN: [id number] Visit Date: 01/31/2022  Subjective:    Chief Complaint  Patient presents with   Office Visit    Medication refill : Desloratadine  No concerns  Tdap give today   HPI ADHD (attention deficit hyperactivity disorder), combined type Continue intuiniv Stable BP BP Readings from Last 3 Encounters:  01/31/22 110/68  01/03/22 120/74  10/17/20 120/78    Mood disorder (HCC) Reports he was diagnosed with Bipolar disorder by provider with [facility]. He started counseling session last week. Has not schedule f/up appt. Reports intermittent hallucination (tactile-feels like bugs crawling on skin, visual- flashes of objects in peripheral visual field, auditory-some calling his name of asking a question). States he has thoughts of worthlessness, which leads to thoughts he might be better off dead. Denies any hx of suicide attempt or suicide ideation. ETOH use: beer every other day Marijuana: daily Tobacco use: 1/2ppd and vaping Denies any illicit drug use. Current use of lexapro and buspar  Advised about the correlation between worsening mental health disorders in combination with marijuana or ETOH use. Advised to quit. Maintain dose of lexapro and buspar Start wellbutrin 150mg  daily. Advised about possible side effects. He is to stop medication and call 911 if worsening mood and/SI suicide ideation. He is to schedule f/up appt with counselor and psychiatry at [facility] F/up in 2weeks  ETOH: 24oz beer every other day Marijuana daily Tobacco use: 1/2ppd, quit 2017-2019 Vaping with nicotine Psychologist: 07-28[birth year]1969 ([facility]) Diagnosed with Bipolar disorder  Wt Readings from Last 3 Encounters:  01/31/22 141 lb 6.4 oz (64.1 kg)  01/03/22 142 lb 6.4 oz (64.6 kg)  10/17/20 151 lb 9.6 oz (68.8 kg)       01/31/2022    1:51 PM 01/03/2022    1:22 PM 10/17/2020    1:51 PM  Depression screen PHQ 2/9  Decreased Interest 2 0   Down, Depressed, Hopeless 2 1 1   PHQ - 2 Score 4 1 1   Altered sleeping 3  2  Tired, decreased energy 2  1  Change in appetite 2  1  Feeling bad or failure about yourself  3  1  Trouble concentrating 2  2  Moving slowly or fidgety/restless 3  2  Suicidal thoughts 1  0  PHQ-9 Score 20  10  Difficult doing work/chores Somewhat difficult  Somewhat difficult       01/31/2022    1:52 PM 10/17/2020    1:51 PM 04/18/2020    9:23 AM 04/22/2019   11:40 AM  GAD 7 : Generalized Anxiety Score  Nervous, Anxious, on Edge 3 2 1 2   Control/stop worrying 2 1 1 1   Worry too much - different things 3 2 0 2  Trouble relaxing 3 1 0 1  Restless 3 1 1  0  Easily annoyed or irritable 3 2 1 2   Afraid - awful might happen 3 1 0 1  Total GAD 7 Score 20 10 4 9   Anxiety Difficulty Somewhat difficult Somewhat difficult Not difficult at all    Reviewed medical, surgical, and social history today  Medications: Outpatient Medications Prior to Visit  Medication Sig   methocarbamol (ROBAXIN) 500 MG tablet Take 1,000 mg by mouth 4 (four) times daily as needed.   [DISCONTINUED] busPIRone (BUSPAR) 10 MG tablet  Take 1 tablet (10 mg total) by mouth 2 (two) times daily. Need office visit for additional refills   [DISCONTINUED] desloratadine (CLARINEX) 5 MG tablet Take 1 tablet (5 mg total) by mouth daily. Need office visit for additional refills   [DISCONTINUED] escitalopram (LEXAPRO) 10 MG tablet Take 1 tablet (10 mg total) by mouth daily. Need office visit for additional refills   [DISCONTINUED] guanFACINE (INTUNIV) 4 MG TB24 ER tablet Take 1 tablet (4 mg total) by mouth daily. Need office visit for additional refills   [DISCONTINUED] ibuprofen (ADVIL) 600 MG tablet Take 600 mg by mouth every 6 (six) hours as needed.   [DISCONTINUED] beclomethasone (BECONASE-AQ) 42 MCG/SPRAY nasal spray Place 2 sprays  into both nostrils daily as needed (sinonasal congestion or itching of allergic rhinitis). Dose is for each nostril. (Patient not taking: Reported on 01/31/2022)   No facility-administered medications prior to visit.   Reviewed past medical and social history.  Social History   Socioeconomic History   Marital status: Single    Spouse name: Not on file   Number of children: 0   Years of education: Not on file   Highest education level: Not on file  Occupational History   Not on file  Tobacco Use   Smoking status: Every Day    Packs/day: 0.25    Types: Cigarettes    Passive exposure: Yes   Smokeless tobacco: Never  Vaping Use   Vaping Use: Every day   Substances: Nicotine  Substance and Sexual Activity   Alcohol use: Yes    Alcohol/week: 5.0 standard drinks of alcohol    Types: 5 Cans of beer per week   Drug use: Yes    Frequency: 7.0 times per week    Types: Other-see comments, Marijuana    Comment: Pt states he has smoked marijuana with friends, and has admitted to taking oxycodone/hydrocodone from family member in the past. Also, is an occasional drinker (beer, little wine, some vodka) with friends but no in   Sexual activity: Yes  Other Topics Concern   Not on file  Social History Narrative   Not on file   Social Determinants of Health   Financial Resource Strain: Not on file  Food Insecurity: Not on file  Transportation Needs: Not on file  Physical Activity: Not on file  Stress: Not on file  Social Connections: Not on file  Intimate Partner Violence: Not on file     ROS per HPI above      Objective:  BP 110/68 (BP Location: Right Arm, Patient Position: Sitting, Cuff Size: Small)   Pulse 60   Temp (!) 97.5 F (36.4 C) (Temporal)   Ht 5\' 6"  (1.676 m)   Wt 141 lb 6.4 oz (64.1 kg)   SpO2 96%   BMI 22.82 kg/m      Physical Exam Psychiatric:        Attention and Perception: Attention normal. He is attentive. He does not perceive auditory or visual  hallucinations.        Mood and Affect: Mood is anxious.        Speech: Speech normal.        Behavior: Behavior is cooperative.        Thought Content: Thought content normal. Thought content is not paranoid or delusional. Thought content does not include homicidal or suicidal ideation. Thought content does not include homicidal or suicidal plan.        Cognition and Memory: Cognition and memory normal.  Judgment: Judgment is impulsive.     No results found for any visits on 01/31/22.    Assessment & Plan:    Problem List Items Addressed This Visit       Other   ADHD (attention deficit hyperactivity disorder), combined type (Chronic)    Continue intuiniv Stable BP BP Readings from Last 3 Encounters:  01/31/22 110/68  01/03/22 120/74  10/17/20 120/78        Relevant Medications   guanFACINE (INTUNIV) 4 MG TB24 ER tablet   buPROPion (WELLBUTRIN XL) 150 MG 24 hr tablet   Generalized anxiety disorder - Primary (Chronic)   Relevant Medications   escitalopram (LEXAPRO) 10 MG tablet   busPIRone (BUSPAR) 10 MG tablet   buPROPion (WELLBUTRIN XL) 150 MG 24 hr tablet   Mood disorder (HCC)    Reports he was diagnosed with Bipolar disorder by provider with [facility]. He started counseling session last week. Has not schedule f/up appt. Reports intermittent hallucination (tactile-feels like bugs crawling on skin, visual- flashes of objects in peripheral visual field, auditory-some calling his name of asking a question). States he has thoughts of worthlessness, which leads to thoughts he might be better off dead. Denies any hx of suicide attempt or suicide ideation. ETOH use: beer every other day Marijuana: daily Tobacco use: 1/2ppd and vaping Denies any illicit drug use. Current use of lexapro and buspar  Advised about the correlation between worsening mental health disorders in combination with marijuana or ETOH use. Advised to  quit. Maintain dose of lexapro and buspar Start wellbutrin 150mg  daily. Advised about possible side effects. He is to stop medication and call 911 if worsening mood and/SI suicide ideation. He is to schedule f/up appt with counselor and psychiatry at [facility] in 2weeks       Other Visit Diagnoses     Non-seasonal allergic rhinitis due to other allergic trigger       Chronic rhinitis       Relevant Medications   desloratadine (CLARINEX) 5 MG tablet   Azelastine HCl 0.15 % SOLN   Other Relevant Orders   Ambulatory referral to Allergy   Chronic pansinusitis       Relevant Medications   desloratadine (CLARINEX) 5 MG tablet   Azelastine HCl 0.15 % SOLN   Other Relevant Orders   Ambulatory referral to Allergy   Need for Tdap vaccination       Relevant Orders   Tdap vaccine greater than or equal to 7yo IM (Completed)      Return in about 2 weeks (around 02/14/2022) for anxiety and depression (labs-cbc, cmp, TSH).     02/16/2022, NP

## 2022-01-31 NOTE — Assessment & Plan Note (Addendum)
Reports he was diagnosed with Bipolar disorder by provider with [facility]. He started counseling session last week. Has not schedule f/up appt. Reports intermittent hallucination (tactile-feels like bugs crawling on skin, visual- flashes of objects in peripheral visual field, auditory-some calling his name of asking a question). States he has thoughts of worthlessness, which leads to thoughts he might be better off dead. Denies any hx of suicide attempt or suicide ideation. ETOH use: beer every other day Marijuana: daily Tobacco use: 1/2ppd and vaping Denies any illicit drug use. Current use of lexapro and buspar  Advised about the correlation between worsening mental health disorders in combination with marijuana or ETOH use. Advised to quit. Maintain dose of lexapro and buspar Start wellbutrin 150mg  daily. Advised about possible side effects. He is to stop medication and call 911 if worsening mood and/SI suicide ideation. He is to schedule f/up appt with counselor and psychiatry at [facility] in 2weeks

## 2022-01-31 NOTE — Assessment & Plan Note (Signed)
Continue intuiniv Stable BP BP Readings from Last 3 Encounters:  01/31/22 110/68  01/03/22 120/74  10/17/20 120/78

## 2022-01-31 NOTE — Patient Instructions (Signed)
Start wellbutrin 150mg  in AM Continue escitalopram 10mg  daily and buspar BID. Schedule appt with psychologist and psychaitry.  Start azelastine nasal spray  Continue clarinex tabs You will be contacted to schedule appt with allergist  Work on eliminating tobacco, marijuana and alcohol use  Managing Bipolar Disorder If you have been diagnosed with bipolar disorder, you may be relieved to now know that this is why you have felt or behaved a certain way. You may also feel overwhelmed about the treatment ahead, how to get needed support, and how to deal with your condition each day. With care and support, you can learn to manage your symptoms and live with your condition. How to manage lifestyle changes Managing stress Stress is your body's reaction to life changes and events, both good and bad. Stress can affect your condition, so it is important to learn how to manage stress. Some techniques to help manage stress include: Meditation, muscle relaxation, and breathing exercises. Exercise. Even a short daily walk can help to lower stress levels. Getting enough good-quality sleep. Too little sleep can trigger the emotional highs of mania. Making a schedule to manage your time. A daily schedule can help keep you from feeling overwhelmed by tasks and deadlines. Spending time on hobbies you enjoy.  Medicines Your health care provider may suggest certain medicines if he or she thinks that these will help improve your condition. Avoid using caffeine, alcohol, and other substances that may prevent your medicines from working properly. Be sure to: Talk with your pharmacist or health care provider about all the medicines that you take, their possible side effects, and which medicines are safe to take together. Make it your goal to take part in all treatment decisions with your health care provider (shared decision-making). Ask about possible side effects of medicines, and share how you feel about having  those side effects. It is best if shared decision-making is part of your total treatment plan. If you are taking medicines as part of your treatment, do not stop taking them before asking if it is safe to stop. You may need to have the medicine slowly decreased (tapered) over time to lower the risk of harmful side effects. Relationships Spend time with people whom you trust and with whom you feel a sense of understanding and calm. Try to find friends or family members who make you feel safe and can help you control feelings of mania. Consider going to couples counseling, family education classes, or family therapy to: Teach your loved ones about your condition and suggest ways they can support you. Help resolve conflicts. Help develop communication skills in your relationships.  How to recognize changes in your condition Everyone responds differently to treatment for bipolar disorder. Signs that your condition is improving include: Leveling of your mood. You may have less anger and excitement about daily activities, and your low moods may not be as bad. Your symptoms being less intense. Feeling calm more often. Thinking clearly. Not experiencing consequences for extreme behavior. Feeling like your life is settling down. Your behavior seeming more normal to you and to other people. Signs that your condition may be getting worse include: Sleep problems. Moods cycling between deep lows and unusually high energy. Extreme emotions, or more anger toward loved ones. Staying away from others, or isolating yourself. A feeling of power or superiority. Completing a lot of tasks in a very short amount of time. Unusual thoughts and behaviors. Thoughts of suicide. Follow these instructions at home: Medicines Take over-the-counter  and prescription medicines only as told by your health care provider or pharmacist. Ask your pharmacist what over-the-counter cold medicines you should avoid. Some medicines  can make symptoms worse. General instructions Ask for support from trusted family members or friends to make sure you stay on track with your treatment. Keep a journal to write down your daily moods, medicines, sleep habits, and life events. This may bring you more success with your treatment. Make and follow a routine for daily meal times. Eat healthy foods, such as whole grains, vegetables, and fresh fruit. Try to go to sleep and wake up around the same time every day. Avoid using products that contain nicotine or tobacco. If you want help quitting, ask your health care provider. Keep all follow-up visits. This is important. Where to find support Helplines and other support Substance Abuse and Mental Health Services Administration [facility]) National Helpline: 1-800-662-HELP (4357) National Alliance on Mental Illness (NAMI) HelpLine: 1-800-950-NAMI (6264) or email helpline@nami .org Depression and Bipolar Support Alliance: dbsalliance.org Talking to others Try making a list of the people you may want to tell about your condition, such as the people you trust most. Plan what you are willing and not willing to talk about. Think about your needs ahead of time and how others can support you. Let your loved ones know when they can share advice and when you would just like them to listen. Give your loved ones information about bipolar disorder and encourage them to learn about the condition. Finances Not all insurance plans provide the same coverage for mental health care, so it is important to check with your insurance carrier. If paying for co-pays or counseling services is a problem, search for a local or county mental health care center. Public mental health care services may be offered there at a low cost or no cost when you are not able to see a private health care provider. If you are taking medicine for depression, you may be able to get the generic form, which may cost less than brand-name  medicine. Some makers of prescription medicines also offer help to people who cannot afford the medicines they need. Questions to ask your health care provider: If you are taking medicines How long do I need to take medicine? Are there any long-term side effects of my medicine? Are there any alternatives to taking medicine? Other questions How would I benefit from therapy? How often should I follow up with a health care provider? Contact a health care provider if: Your symptoms get worse or they do not get better with treatment. Get help right away if: You have thoughts about harming yourself or others. If you ever feel like you may hurt yourself or others, or have thoughts about taking your own life, get help right away. Go to your nearest emergency department or: Call your local emergency services (911 in the [place] Call a suicide crisis helpline, such as the National Suicide Prevention Lifeline at (212)848-5491 or 988 in the [place] This is open 24 hours a day in the [place] Text the Crisis Text Line at 218 060 4253 (in the [place] Summary Stress can affect your condition, so it is important to learn how to manage stress. Aim to take part in all treatment decisions (shared decision-making). Teach your loved ones about your condition and suggest ways they can support you. Contact a health care provider if your symptoms get worse or they do not get better with treatment. This information is not intended to replace advice given to  you by your health care provider. Make sure you discuss any questions you have with your health care provider. Document Revised: 02/28/2021 Document Reviewed: 01/24/2021 Elsevier Patient Education  2023 [employer].

## 2022-02-14 ENCOUNTER — Ambulatory Visit: Payer: No Typology Code available for payment source | Admitting: Nurse Practitioner

## 2022-02-14 ENCOUNTER — Telehealth: Payer: Self-pay | Admitting: Nurse Practitioner

## 2022-02-14 NOTE — Telephone Encounter (Signed)
6.30.2023 no show letter

## 2022-02-28 NOTE — Telephone Encounter (Signed)
error 

## 2022-03-17 NOTE — Telephone Encounter (Signed)
1st no show, fee waived, letter sent 

## 2022-07-29 ENCOUNTER — Other Ambulatory Visit: Payer: Self-pay | Admitting: Nurse Practitioner

## 2022-07-29 DIAGNOSIS — F902 Attention-deficit hyperactivity disorder, combined type: Secondary | ICD-10-CM

## 2022-07-29 DIAGNOSIS — F411 Generalized anxiety disorder: Secondary | ICD-10-CM

## 2022-07-29 NOTE — Telephone Encounter (Signed)
Chart supports Rx Last OV: 01/2022 Next OV: not scheduled. 30 day supply sent. Pt needs f/u appt for further refills.

## 2022-08-25 ENCOUNTER — Other Ambulatory Visit: Payer: Self-pay | Admitting: Nurse Practitioner

## 2022-08-25 DIAGNOSIS — F902 Attention-deficit hyperactivity disorder, combined type: Secondary | ICD-10-CM

## 2022-08-25 DIAGNOSIS — F411 Generalized anxiety disorder: Secondary | ICD-10-CM

## 2023-01-26 ENCOUNTER — Other Ambulatory Visit: Payer: Self-pay | Admitting: Nurse Practitioner

## 2023-01-26 DIAGNOSIS — J310 Chronic rhinitis: Secondary | ICD-10-CM

## 2023-01-26 DIAGNOSIS — J324 Chronic pansinusitis: Secondary | ICD-10-CM

## 2023-01-26 NOTE — Telephone Encounter (Signed)
Pt needs appointment

## 2023-03-27 ENCOUNTER — Other Ambulatory Visit: Payer: Self-pay | Admitting: Nurse Practitioner

## 2023-03-27 DIAGNOSIS — F902 Attention-deficit hyperactivity disorder, combined type: Secondary | ICD-10-CM

## 2023-03-27 NOTE — Telephone Encounter (Signed)
Needs appointment
